# Patient Record
Sex: Female | Born: 1935 | Race: Black or African American | Hispanic: No | Smoking: Never smoker
Health system: Southern US, Community
[De-identification: ages and names within clinical notes are randomized; demographics above are authoritative.]

## PROBLEM LIST (undated history)

## (undated) DIAGNOSIS — E119 Type 2 diabetes mellitus without complications: Secondary | ICD-10-CM

## (undated) DIAGNOSIS — Z87898 Personal history of other specified conditions: Secondary | ICD-10-CM

## (undated) DIAGNOSIS — F0390 Unspecified dementia without behavioral disturbance: Secondary | ICD-10-CM

## (undated) DIAGNOSIS — I10 Essential (primary) hypertension: Secondary | ICD-10-CM

## (undated) DIAGNOSIS — F209 Schizophrenia, unspecified: Secondary | ICD-10-CM

## (undated) DIAGNOSIS — I509 Heart failure, unspecified: Secondary | ICD-10-CM

## (undated) HISTORY — DX: Type 2 diabetes mellitus without complications: E11.9

## (undated) HISTORY — DX: Essential (primary) hypertension: I10

## (undated) HISTORY — DX: Heart failure, unspecified: I50.9

## (undated) HISTORY — DX: Schizophrenia, unspecified: F20.9

## (undated) HISTORY — DX: Personal history of other specified conditions: Z87.898

## (undated) HISTORY — DX: Unspecified dementia, unspecified severity, without behavioral disturbance, psychotic disturbance, mood disturbance, and anxiety: F03.90

---

## 2004-05-05 ENCOUNTER — Ambulatory Visit: Payer: Self-pay | Admitting: Internal Medicine

## 2004-12-08 ENCOUNTER — Ambulatory Visit: Payer: Self-pay | Admitting: Internal Medicine

## 2004-12-22 ENCOUNTER — Ambulatory Visit: Payer: Self-pay | Admitting: Internal Medicine

## 2005-06-07 ENCOUNTER — Inpatient Hospital Stay: Payer: Self-pay | Admitting: Psychiatry

## 2005-06-07 ENCOUNTER — Other Ambulatory Visit: Payer: Self-pay

## 2005-06-16 ENCOUNTER — Other Ambulatory Visit: Payer: Self-pay

## 2005-06-16 ENCOUNTER — Inpatient Hospital Stay: Payer: Self-pay | Admitting: Internal Medicine

## 2005-06-22 ENCOUNTER — Other Ambulatory Visit: Payer: Self-pay

## 2006-10-10 IMAGING — CT CT CHEST W/O CM
2 series · 15 of 31 positions shown, 19 images · non-contrast
Comparison: none

REASON FOR EXAM: Left chest abnormality
COMMENTS:

[Series 2: soft tissue · axial · 0.77mm/px · z∈[-706,-666]mm · 2 of 56 slices shown]
[im 5/56  mediastinal]
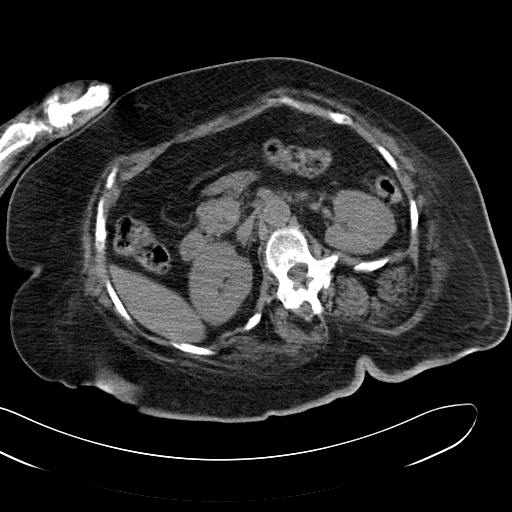
[im 13/56  mediastinal]
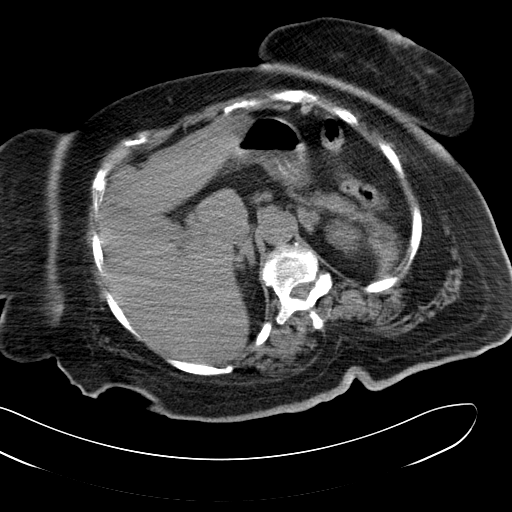

[Series 3: lung windows · axial · 0.77mm/px · z∈[-696,-476]mm · 13 of 54 slices shown, 17 images]
[im 5/54  mediastinal]
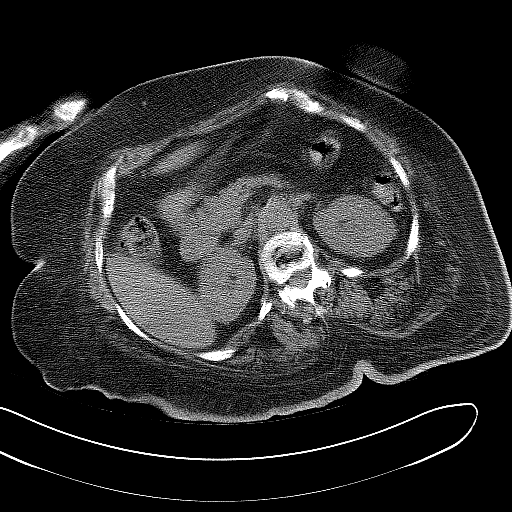
[im 5/54  lung]
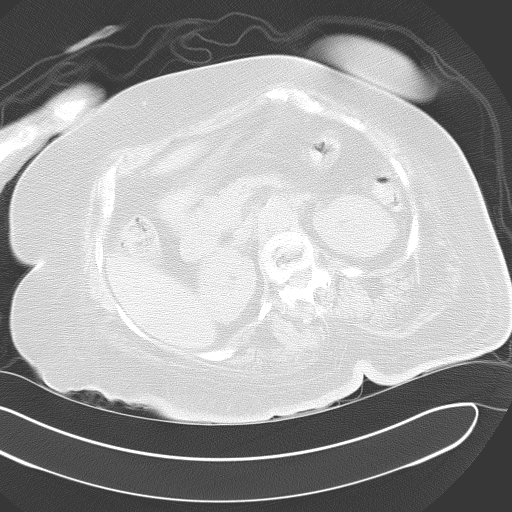
[im 9/54  lung]
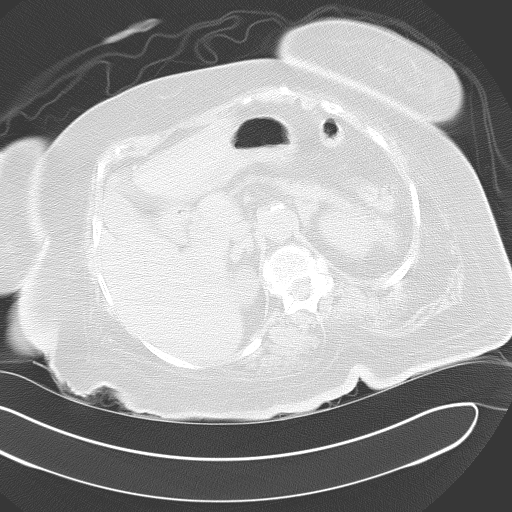
[im 13/54  lung]
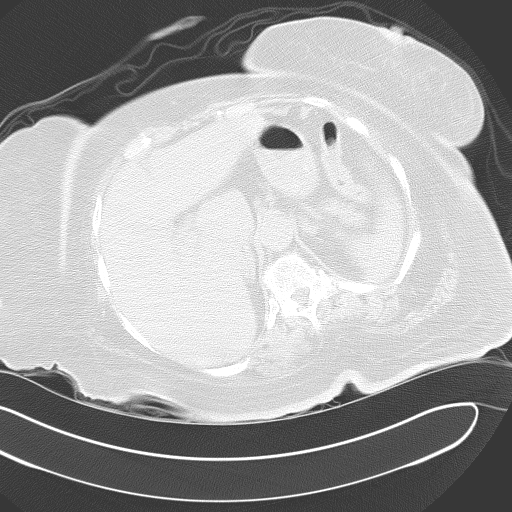
[im 17/54  lung]
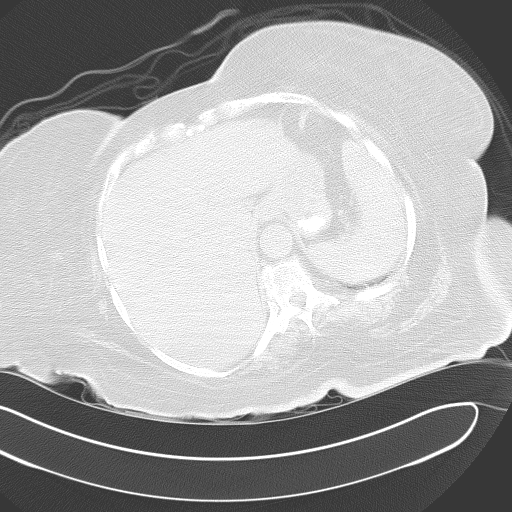
[im 21/54  mediastinal]
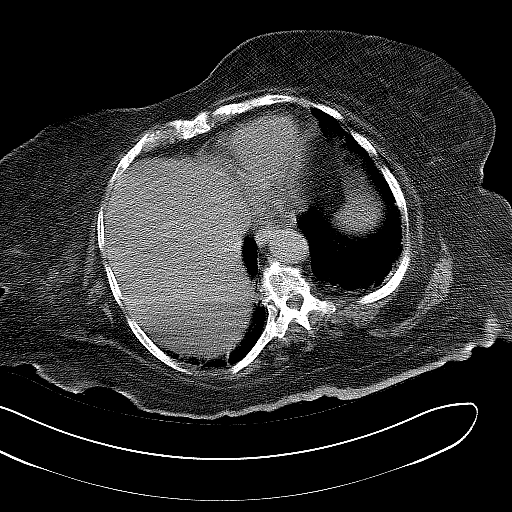
[im 21/54  lung]
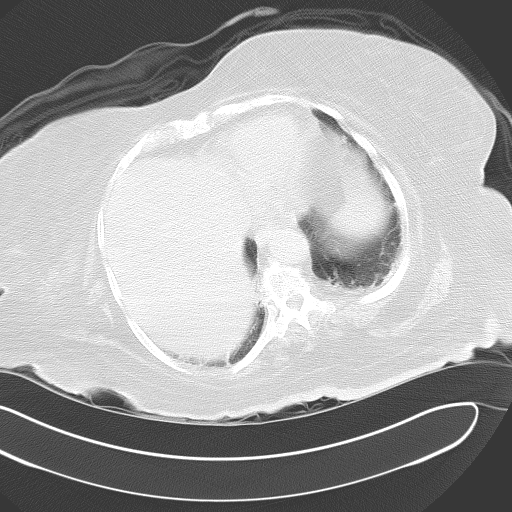
[im 25/54  lung]
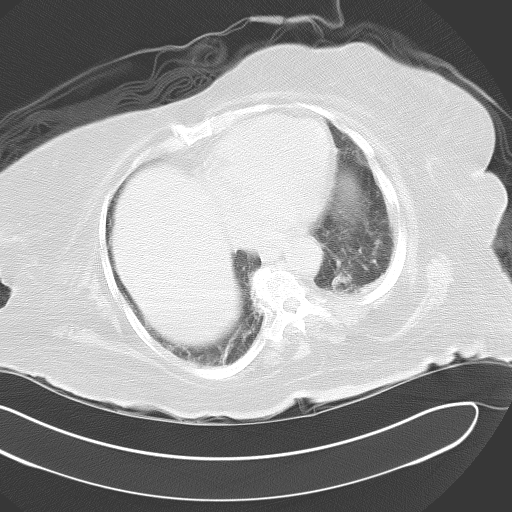
[im 27/54  lung]
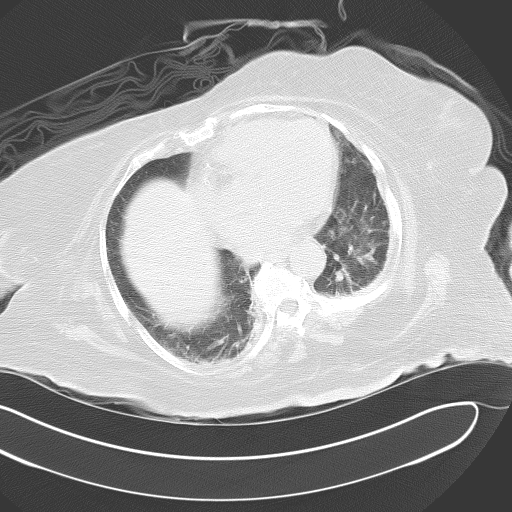
[im 29/54  lung]
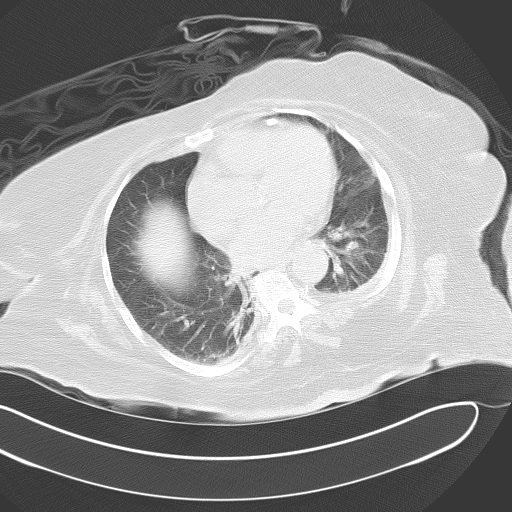
[im 33/54  mediastinal]
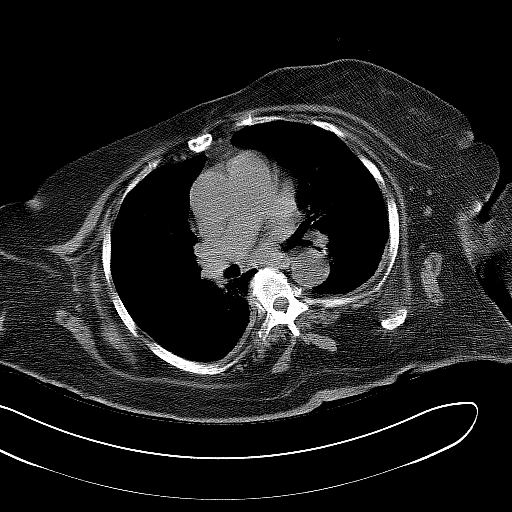
[im 33/54  lung]
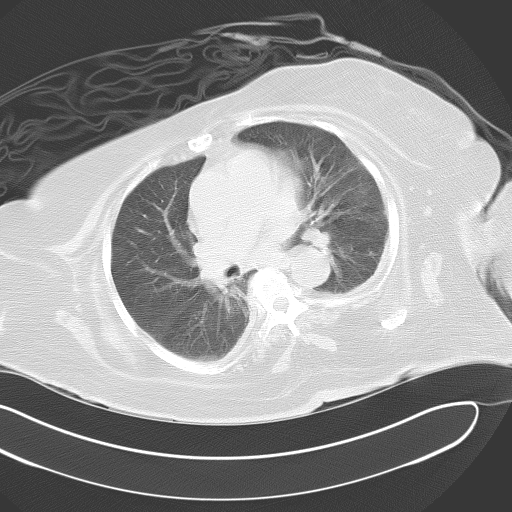
[im 37/54  lung]
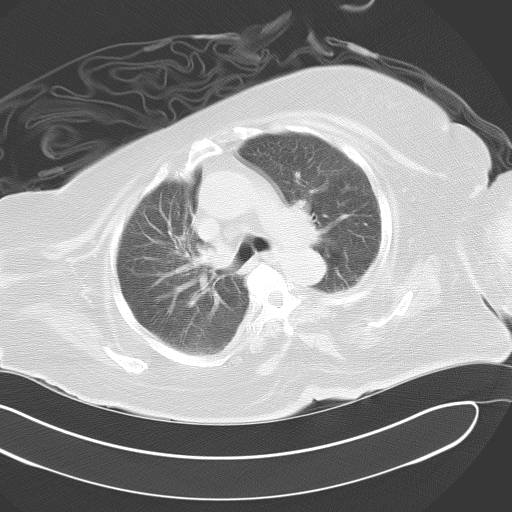
[im 41/54  lung]
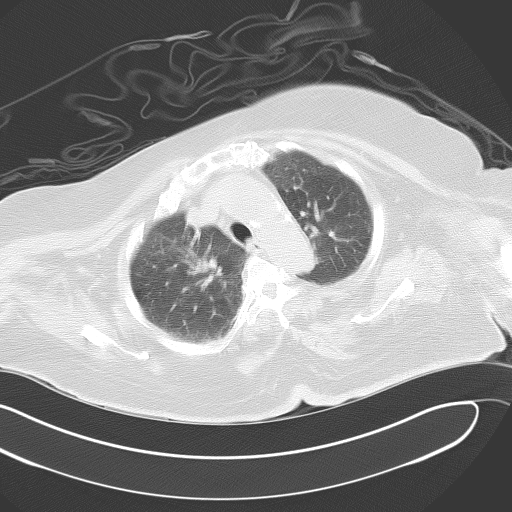
[im 45/54  lung]
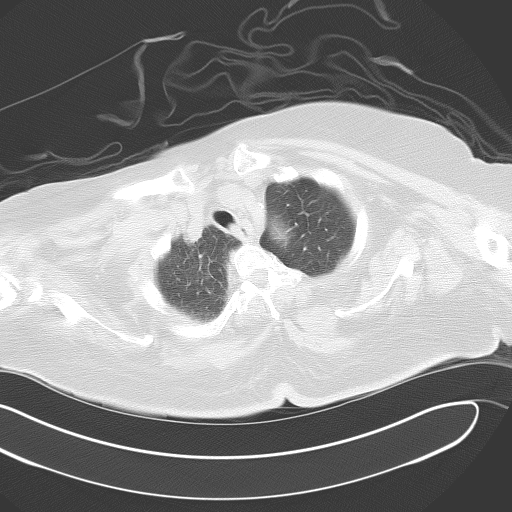
[im 49/54  mediastinal]
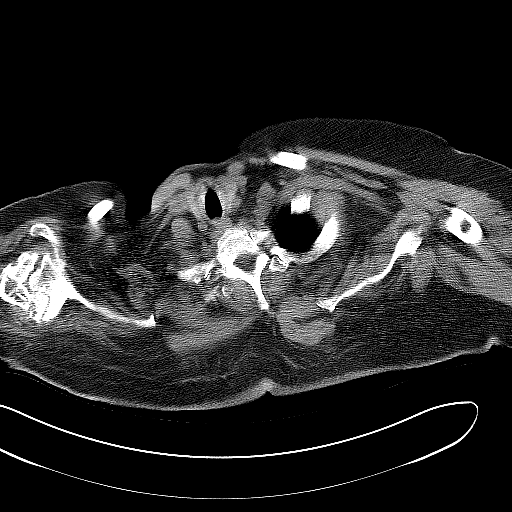
[im 49/54  lung]
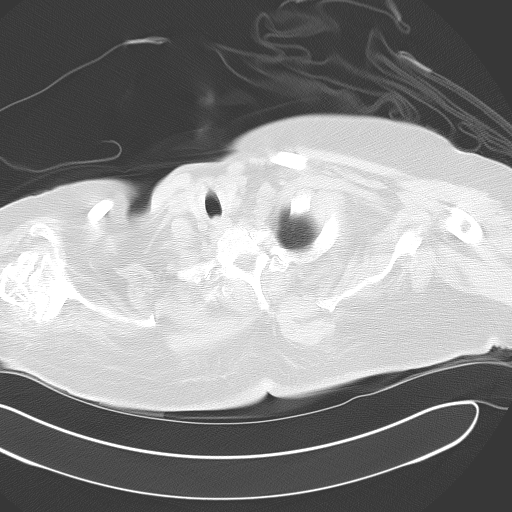

[15 of 31 positions shown; findings below may reference images not displayed]

PROCEDURE:     CT  - CT CHEST WITHOUT CONTRAST  - June 19, 2005  [DATE]

RESULT:       This study was performed without IV contrast as requested.

The caliber of the thoracic aorta is normal.    The cardiac chambers are not
enlarged.  I see no pleural nor pericardial effusion.  No bulky mediastinal
or hilar lymphadenopathy is seen.  There are degenerative changes noted at
multiple thoracic disc levels.  Extensive degenerative change of the RIGHT
shoulder is evident with a possible ununited fracture visible.  At lung
window settings, a very small amount of apical pleural scarring is present.
There are mildly increased interstitial markings in both lungs that likely
is reflective of pulmonary hypoinflation rather than to significant edema.
There is some scarring versus atelectasis at the lung bases.

Within the upper abdomen, the observed portions of the liver are normal for
this noncontrast study.  the spleen is not enlarged.  I see no adrenal
masses.
IMPRESSION: 1.     There is bilateral pulmonary hypoinflation.   I see no parenchymal
masses nor evidence of alveolar infiltrate.
2.     I do not see pathologic size mediastinal or hilar lymph nodes.
Certainly, it is difficult to separate possible lymph nodes from the
pulmonary vasculature given the lack of IV contrast.
3.     The heart is top normal in size with no overt evidence of congestive
heart failure.
4.     There is degenerative disc disease at multiple levels of the thoracic
spine.

## 2006-10-12 IMAGING — US ABDOMEN ULTRASOUND
1 series · 17 of 25 positions shown · non-contrast
Comparison: none

REASON FOR EXAM: Abnormal LFT's and fever
COMMENTS:

[Series 1: abdomen ultrasound · 17 of 29 slices shown]
[im 1/29]
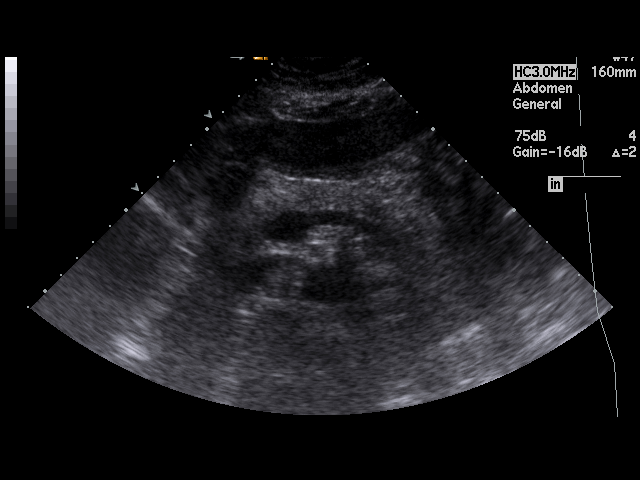
[im 3/29]
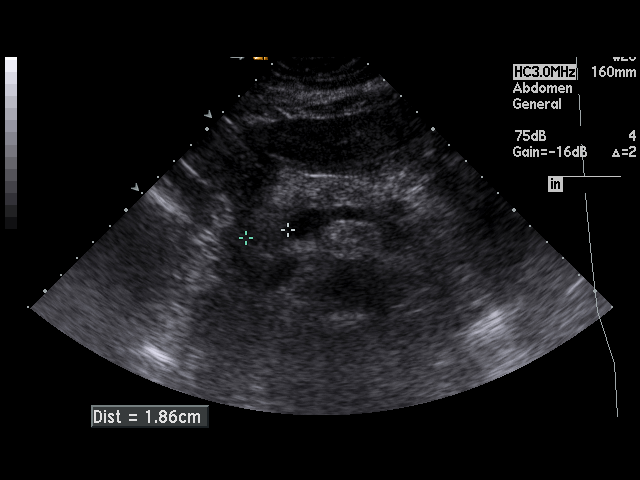
[im 4/29]
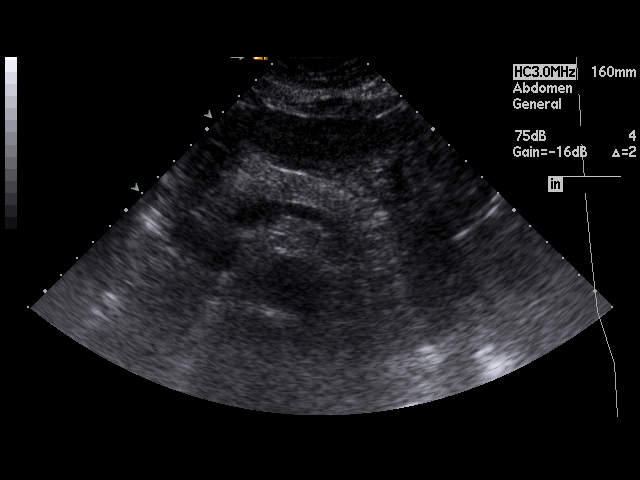
[im 6/29]
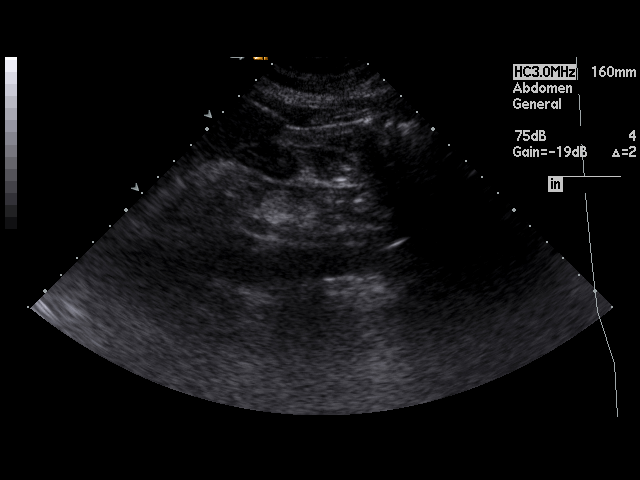
[im 8/29]
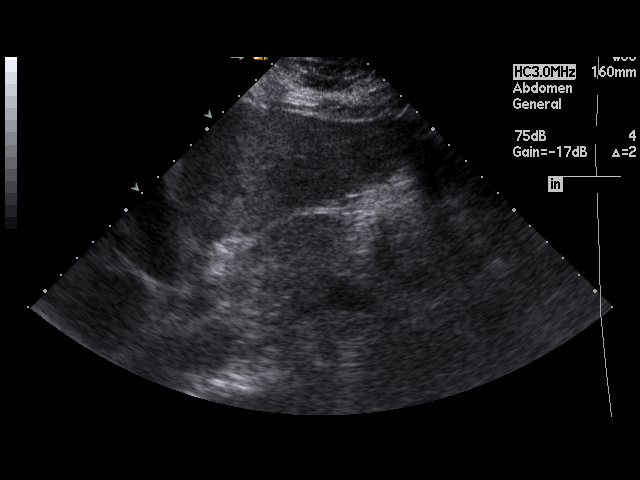
[im 10/29]
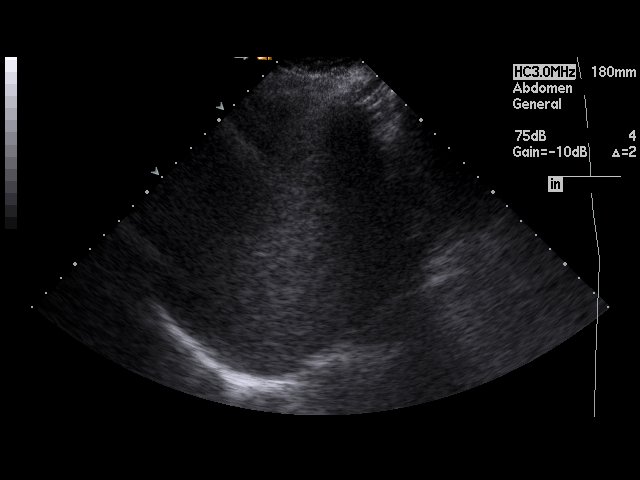
[im 11/29]
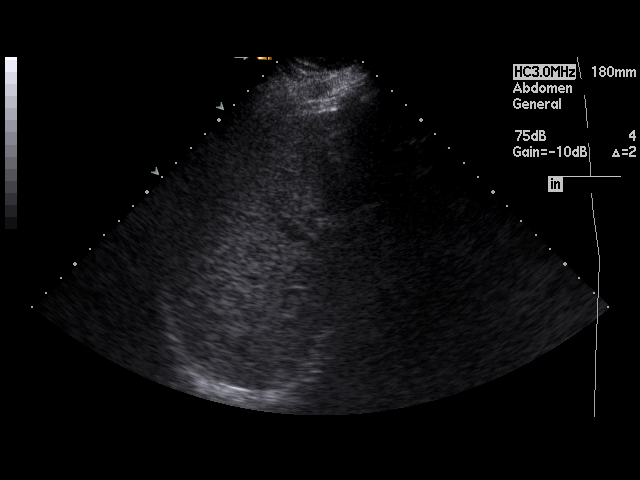
[im 13/29]
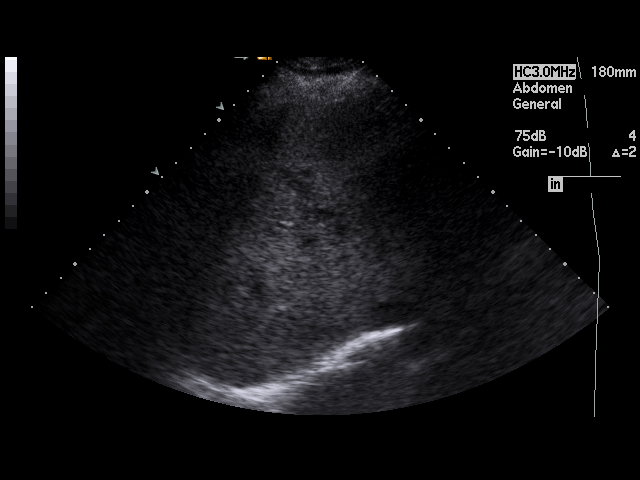
[im 15/29]
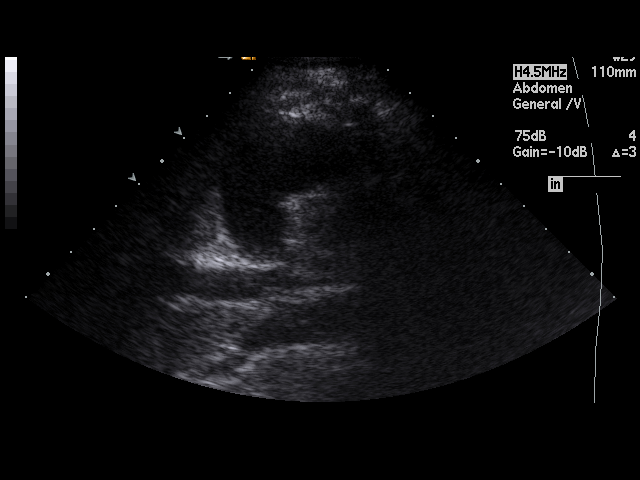
[im 16/29]
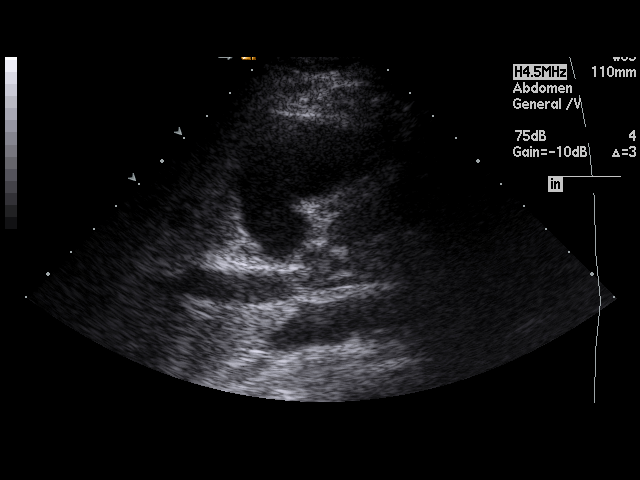
[im 18/29]
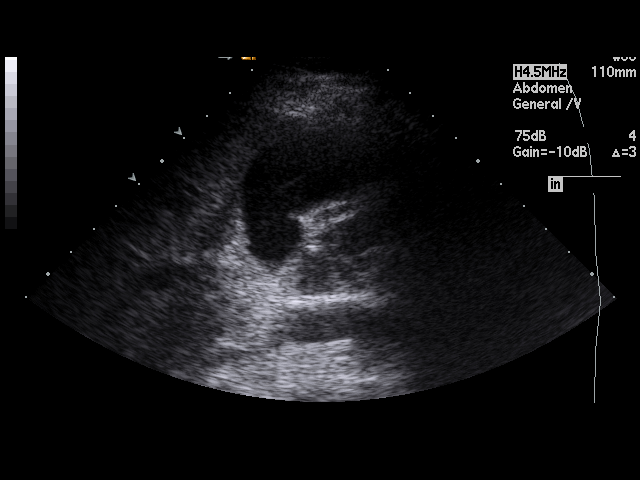
[im 19/29]
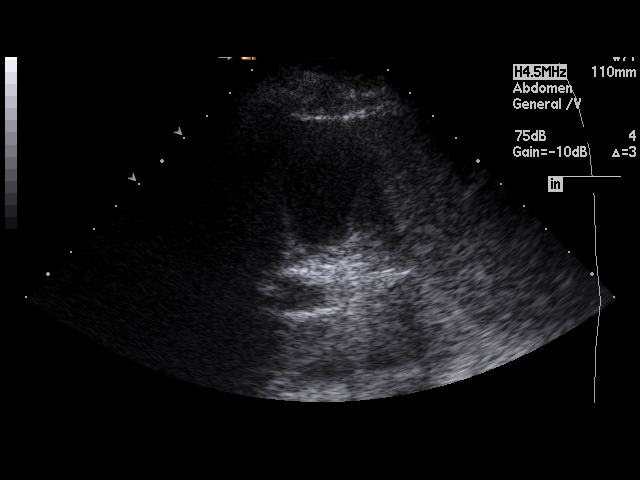
[im 22/29]
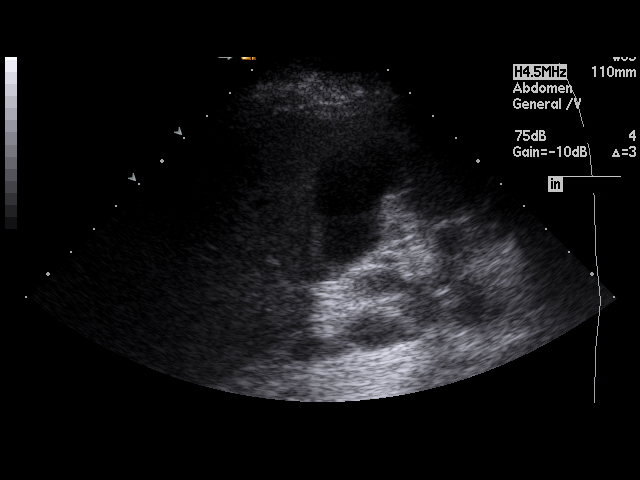
[im 23/29]
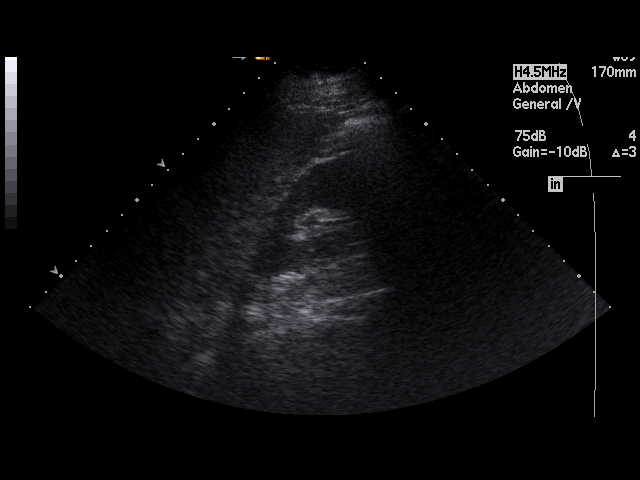
[im 25/29]
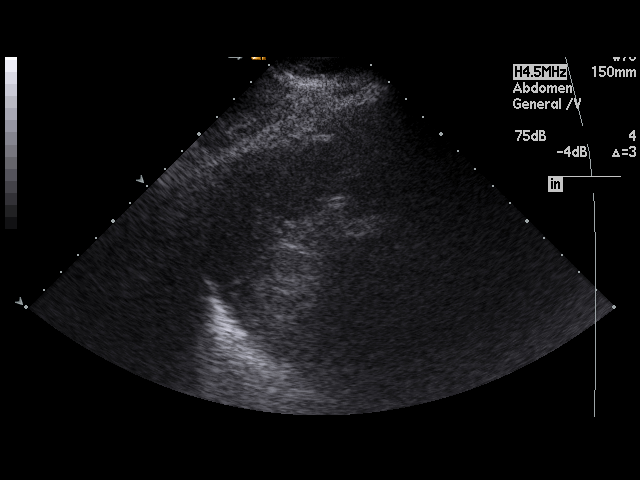
[im 26/29]
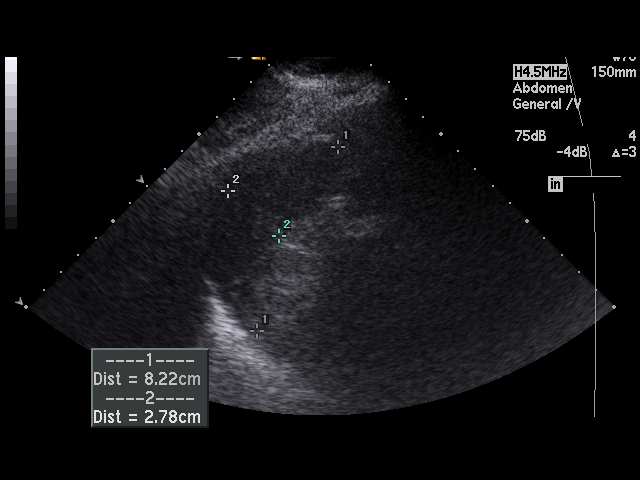
[im 29/29]
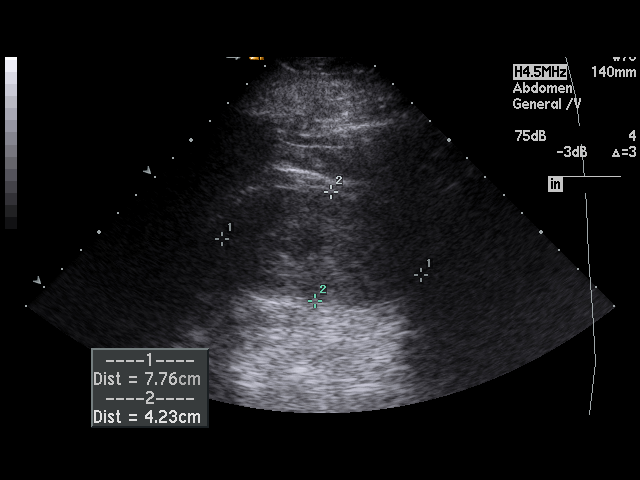

[17 of 25 positions shown; findings below may reference images not displayed]

PROCEDURE:     US  - US ABDOMEN GENERAL SURVEY  - June 21, 2005  [DATE]

RESULT:        The patient has abnormal hepatic function studies and
elevated temperature.

The study was limited due to the patient's clinical status and inability to
move from side to side.  The observed portions of the gallbladder are
grossly normal with no definite stones or wall thickening.  The common bile
duct is normal at 3.5 mm in diameter.  The observed portions of the liver
appeared normal.  The pancreas, spleen, and abdominal aorta were normal in
appearance.  The kidneys exhibit no evidence of obstruction.  Only a portion
of the LEFT kidney could be demonstrated.
IMPRESSION: The study is quite limited due to the patient's inability to move from side
to side.   I do not see objective evidence of gallstones nor other acute
intra-abdominal abnormality.  It may be of value to consider the patient for
CT scanning of the abdomen and pelvis given the laboratory abnormalities and
the patient's fever.

## 2006-10-25 IMAGING — CR DG CHEST 1V PORT
1 series · 1 of 1 positions shown · non-contrast
Comparison: none

REASON FOR EXAM: Rapid breathing and heart rate
COMMENTS:

[view not recorded]
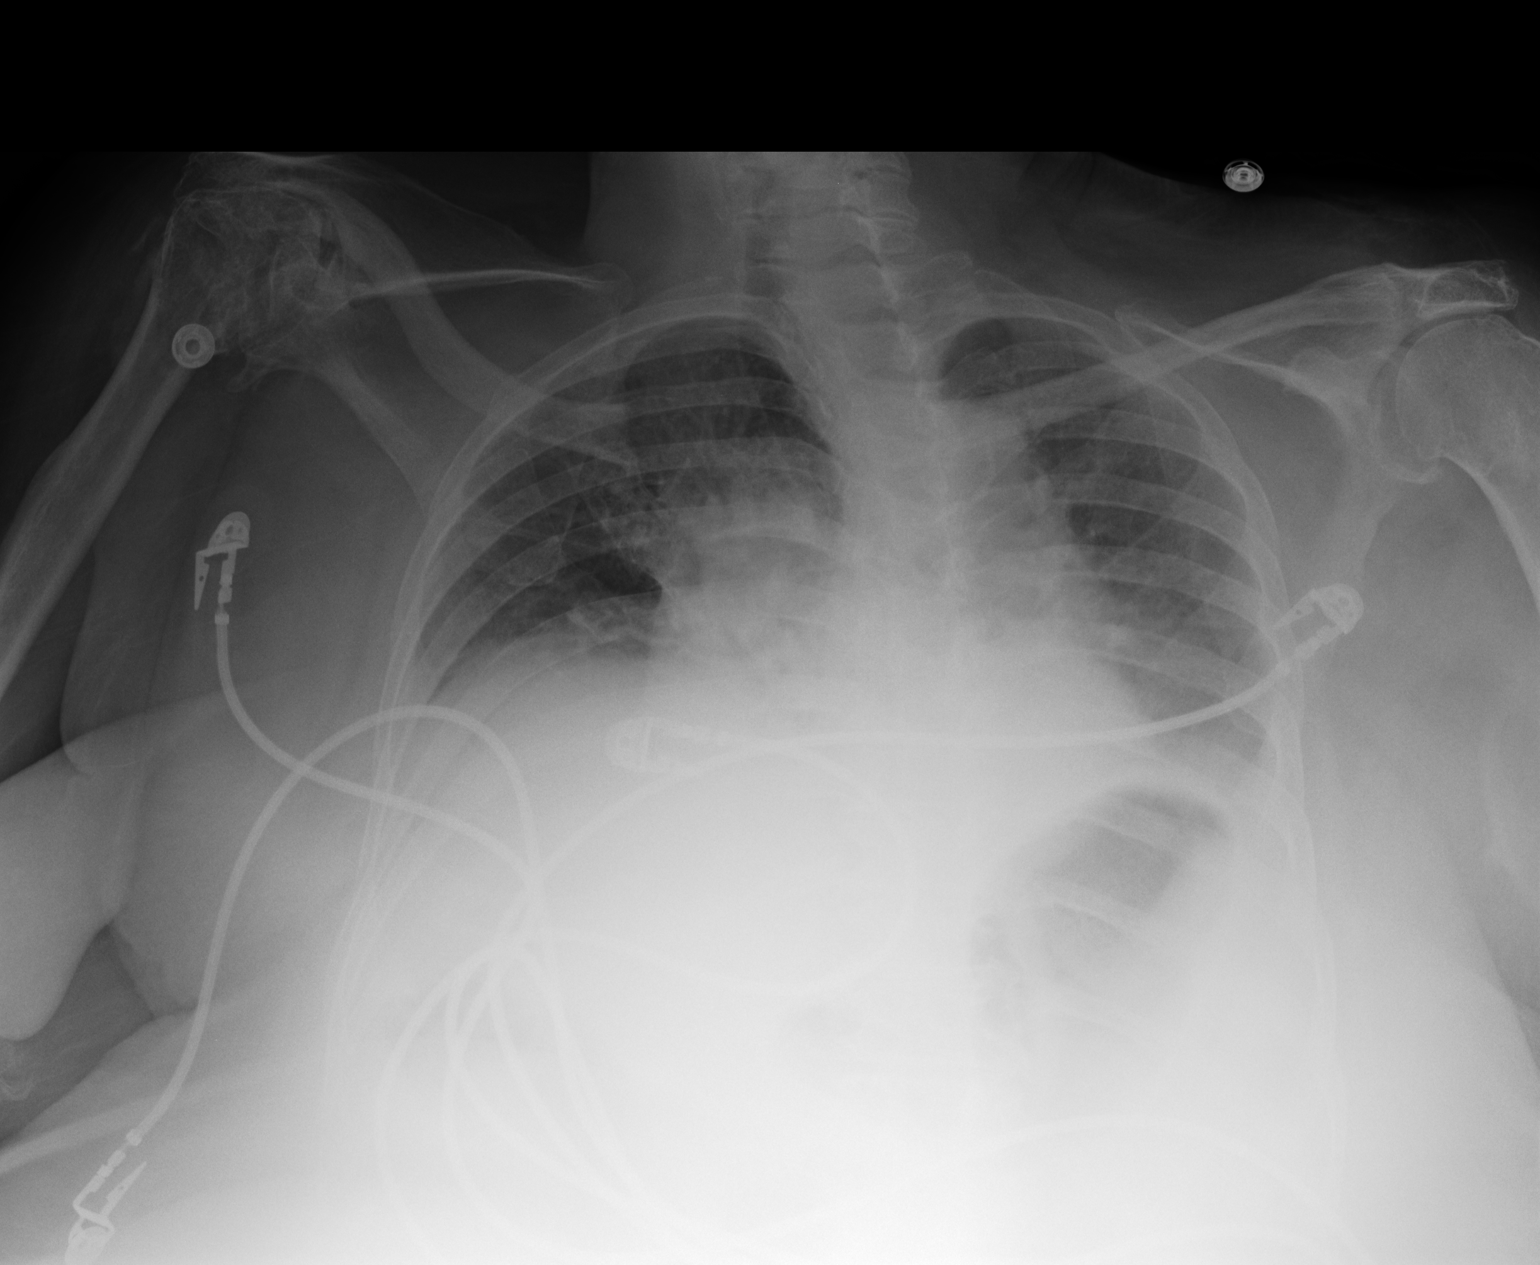

[1 of 1 positions shown; findings below may reference images not displayed]

PROCEDURE:     DXR - DXR PORTABLE CHEST SINGLE VIEW  - July 04, 2005  [DATE]

RESULT:     AP portable exam is obtained and compared with a prior exam of
06/29/2005.

The inspiratory effort appears incomplete. The lung fields appear clear. No
effusions are noted. There are noted degenerative changes in the RIGHT
shoulder.
IMPRESSION: Incomplete inspiratory effort. The lung fields are clear.

## 2009-03-03 ENCOUNTER — Emergency Department: Payer: Self-pay | Admitting: Emergency Medicine

## 2009-11-20 ENCOUNTER — Ambulatory Visit: Payer: Self-pay

## 2010-04-06 ENCOUNTER — Ambulatory Visit: Payer: Self-pay | Admitting: Internal Medicine

## 2012-05-27 ENCOUNTER — Ambulatory Visit: Payer: Self-pay | Admitting: Internal Medicine

## 2012-06-28 ENCOUNTER — Ambulatory Visit: Payer: Self-pay | Admitting: Oncology

## 2012-07-24 ENCOUNTER — Ambulatory Visit: Payer: Self-pay | Admitting: Oncology

## 2012-08-29 ENCOUNTER — Ambulatory Visit: Payer: Self-pay | Admitting: Oncology

## 2012-09-21 ENCOUNTER — Ambulatory Visit: Payer: Self-pay | Admitting: Oncology

## 2012-10-22 ENCOUNTER — Ambulatory Visit: Payer: Self-pay | Admitting: Oncology

## 2012-11-26 ENCOUNTER — Ambulatory Visit: Payer: Self-pay | Admitting: Oncology

## 2012-11-28 ENCOUNTER — Ambulatory Visit: Payer: Self-pay | Admitting: Oncology

## 2012-12-22 ENCOUNTER — Ambulatory Visit: Payer: Self-pay | Admitting: Oncology

## 2013-06-20 ENCOUNTER — Ambulatory Visit: Payer: Self-pay | Admitting: Orthopaedic Surgery

## 2013-06-20 ENCOUNTER — Inpatient Hospital Stay: Payer: Self-pay | Admitting: Internal Medicine

## 2013-06-20 LAB — URINALYSIS, COMPLETE
Bilirubin,UR: NEGATIVE
Ketone: NEGATIVE
Leukocyte Esterase: NEGATIVE
Ph: 6 (ref 4.5–8.0)
Protein: 30
Specific Gravity: 1.021 (ref 1.003–1.030)
Squamous Epithelial: >30

## 2013-06-20 LAB — COMPREHENSIVE METABOLIC PANEL
Alkaline Phosphatase: 77 U/L
Anion Gap: 8 (ref 7–16)
Bilirubin,Total: 0.3 mg/dL (ref 0.2–1.0)
Calcium, Total: 8.8 mg/dL (ref 8.5–10.1)
Chloride: 107 mmol/L (ref 98–107)
Co2: 25 mmol/L (ref 21–32)
Creatinine: 0.86 mg/dL (ref 0.60–1.30)
EGFR (African American): >60
Potassium: 4 mmol/L (ref 3.5–5.1)
SGOT(AST): 18 U/L (ref 15–37)
SGPT (ALT): 25 U/L (ref 12–78)
Sodium: 140 mmol/L (ref 136–145)
Total Protein: 7.9 g/dL (ref 6.4–8.2)

## 2013-06-20 LAB — CBC WITH DIFFERENTIAL/PLATELET
Basophil #: 0.1 10*3/uL (ref 0.0–0.1)
Eosinophil #: 0 10*3/uL (ref 0.0–0.7)
Eosinophil %: 0.2 %
HGB: 12.9 g/dL (ref 12.0–16.0)
Lymphocyte #: 1.9 10*3/uL (ref 1.0–3.6)
Lymphocyte %: 10.8 %
Monocyte #: 0.9 x10 3/mm (ref 0.2–0.9)
Neutrophil #: 14.9 10*3/uL — ABNORMAL HIGH (ref 1.4–6.5)
Neutrophil %: 83.4 %
Platelet: 207 10*3/uL (ref 150–440)
RDW: 14.3 % (ref 11.5–14.5)

## 2013-06-20 LAB — PROTIME-INR: Prothrombin Time: 14.2 secs (ref 11.5–14.7)

## 2013-06-21 LAB — PROTIME-INR: INR: 1.3

## 2013-06-21 LAB — BASIC METABOLIC PANEL
Anion Gap: 4 — ABNORMAL LOW (ref 7–16)
Chloride: 107 mmol/L (ref 98–107)
EGFR (African American): 59 — ABNORMAL LOW
EGFR (Non-African Amer.): 51 — ABNORMAL LOW
Glucose: 128 mg/dL — ABNORMAL HIGH (ref 65–99)
Osmolality: 283 (ref 275–301)
Sodium: 139 mmol/L (ref 136–145)

## 2013-06-21 LAB — CBC WITH DIFFERENTIAL/PLATELET
Basophil #: 0.1 10*3/uL (ref 0.0–0.1)
Eosinophil #: 0.4 10*3/uL (ref 0.0–0.7)
Eosinophil %: 2.3 %
HGB: 10.2 g/dL — ABNORMAL LOW (ref 12.0–16.0)
Lymphocyte #: 2.9 10*3/uL (ref 1.0–3.6)
Lymphocyte %: 17.9 %
MCH: 29.7 pg (ref 26.0–34.0)
MCHC: 33.3 g/dL (ref 32.0–36.0)
MCV: 89 fL (ref 80–100)
Monocyte #: 1.9 x10 3/mm — ABNORMAL HIGH (ref 0.2–0.9)
Monocyte %: 11.7 %
Neutrophil #: 11.1 10*3/uL — ABNORMAL HIGH (ref 1.4–6.5)
Neutrophil %: 67.8 %
Platelet: 201 10*3/uL (ref 150–440)
RDW: 14.1 % (ref 11.5–14.5)
WBC: 16.3 10*3/uL — ABNORMAL HIGH (ref 3.6–11.0)

## 2013-07-30 ENCOUNTER — Encounter: Payer: Self-pay | Admitting: *Deleted

## 2013-08-04 ENCOUNTER — Encounter: Payer: Self-pay | Admitting: Cardiovascular Disease

## 2014-11-13 NOTE — Discharge Summary (Signed)
PATIENT NAME:  Shannon Krause, Shannon Krause MR#:  [id number] DATE OF BIRTH:  Dec 08, 1928  DATE OF ADMISSION:  06/20/2013 DATE OF DISCHARGE:  06/23/2013  DISCHARGE DIAGNOSES: 1.  Left femur fracture, status post left hip pinning postoperative day #3, status post fall and now doing well, requiring aggressive therapy.  2.  Uncomplicated urinary tract infection, finished antibiotics.  3.  Chronic diastolic heart failure, will require outpatient cardiology followup.  She is well compensated at this time.   SECONDARY DIAGNOSES: 1.  Hypertension.  2.  Diabetes.  3.  Dementia.  4.  Schizophrenia.   CONSULTATIONS: 1.  Orthopedic, Weyman Pedro, MD 2.  Physical therapy.   PROCEDURES AND RADIOLOGY:  1.  Left hip fracture, status post repair by Dr. Jeannene Patella on the 28th of November.  2.  Left hip x-ray on the 28th of November showed significant reduction of the left intertrochanteric hip fracture following ORIF.  3.  Left hip x-ray on the 28th of November showed ORIF of the intertrochanteric fracture.  4.  Chest x-ray on the 28th of November showed no evidence of acute cardiopulmonary disease.   MAJOR LABORATORY PANEL:  1.  UA on admission was showing 2+ bacteria, 15 to 30 WBCs  in clumps.  2.  Urine culture was negative on the 27th of November.   HISTORY AND SHORT HOSPITAL COURSE: The patient is an 81 year old female with the above-mentioned medical problems who was admitted status post fall with left hip fracture. Please see Dr. Rob Hickman dictated history and physical for further details. Orthopedic consultation was done with Dr. Danton Sewer, who operated on her on the 28th of November without any immediate complication. The patient subsequently was doing well, was working with physical therapy and is being discharged to rehab facility today in stable condition.   PHYSICAL EXAMINATION: VITAL SIGNS: On the date of discharge, her vital signs are as follows: Temperature 96.6, heart rate 71 per minute,  respirations 18 per minute, blood pressure 125/75 mmHg.  She is saturating 96% on room air.   CARDIOVASCULAR: S1, S2 normal. No murmurs, rubs or gallop.  LUNGS: Clear to auscultation bilaterally. No wheezing, rales, rhonchi or crepitation.  ABDOMEN: Soft, benign.  NEUROLOGIC: Nonfocal examination. All other physical examination remained at baseline.   DISCHARGE MEDICATIONS: 1.  Amlodipine 10 mg p.o. daily.  2.  Atorvastatin 10 mg p.o. at bedtime.  3.  Coreg 25 mg p.o. b.i.d.  4.  Multivitamins once daily.  6.  Donepezil 10 mg p.o. at bedtime.  7.  Insulin, Levemir 8 units subcu at bedtime.  8.  Mapap 650 mg p.o. every 4 hours as needed.  9.  Namenda 10 mg by mouth twice daily.  11.  Insulin, NovoLog 5 units subcu 3 times a day.  12.  Potassium chloride 20 mEq p.o. b.i.d.  13.  Robafen DM 10 mL p.o. every 4 hours as needed.  14.  Sertraline 25 mg p.o. daily.  15.  Systane ophthalmic solution 2 drops to each eye twice a day.  16.  Vitamin D3 50,000 international units once a month.  17.  Magnesium oxide 400 mg p.o. b.i.d.  18.  Risperidone 0.25 mg half tablet p.o. daily.  19.  Timolol 0.25% eye drop to each affected eye 1 at bedtime.  20.  Lovenox 30 mg subcu twice a day for 2 more weeks.   DISCHARGE DIET: Low sodium, low cholesterol, low fat, monitoring daily.     DISCHARGE ACTIVITY: As tolerated and also per orthopedic instruction.  DISCHARGE INSTRUCTIONS AND FOLLOWUP: The patient was instructed to follow up with her primary care physician, Dr. Toy CookeyErnest Eason, in 1 to 2 weeks at [facility].  She will need [facility], Dr. Mariah MillingGollan or Dr. Kirke CorinArida, in 2 to 4 weeks. [facility] in 4 to 6 weeks.   TOTAL TIME SPENT WITH THIS PATIENT:  55 minutes.    ____________________________ Ellamae SiaVipul S. Sherryll BurgerShah, MD vss:cs D: 06/23/2013 13:56:00 ET T: 06/23/2013 14:50:59 ET JOB#: [id number]  cc: Danilyn Cocke S. Sherryll BurgerShah, MD,  <Dictator> Serita ShellerErnest B. Maryellen PileEason, MD Antonieta Ibaimothy J. Gollan, MD Chelsea AusMuhammad A. Kirke CorinArida, MD Illene LabradorJames P. Angie FavaHooten Jr., MD Ellamae SiaVIPUL S [facility] MD ELECTRONICALLY SIGNED 06/26/2013 18:06

## 2014-11-13 NOTE — H&P (Signed)
PATIENT NAME:  Shannon Krause, Shannon Krause MR#:  [id number] DATE OF BIRTH:  November 11, 1928  DATE OF ADMISSION:  06/20/2013  PRIMARY CARE PHYSICIAN:  Dr. _____  REFERRING PHYSICIAN: Dr. Bayard Males   CHIEF COMPLAINT:  Fall and left hip intertrochanteric fracture.   HISTORY OF PRESENT ILLNESS:  The patient is an 81 year old African-American female with a past medical history of diabetes mellitus, dementia, schizophrenia, congestive heart failure and hypertension, currently residing at [facility] with a DNR CODE STATUS, has sustained a fall yesterday. As the patient is demented and no family members are available at bedside, I could not obtain any history from the patient. According to the ER records and ER  hospital staff, the patient rolled out of the bed yesterday at around 2:00 p.m. [facility] staff called ambulatory x-ray unit and the patient had x-ray done and diagnosed with left hip intertrochanteric fracture and the patient was eventually transferred to ER yesterday evening. The patient was given some pain medications to control her pain. On-call orthopedist doctor, Dr. Jeannene Patella, was called and notified by ER physician, Dr. Bayard Males. Hospitalist team is called to admit the patient. As reported before, with the patient being demented and with schizophrenia, was unable to get any history from the patient. No family members are available at bedside and I could not reach any of them. The patient could recall her name, but unable to obtain a history; but she is resting comfortably.   PAST MEDICAL HISTORY: Recent history of abnormal mammogram, chronic history of congestive heart failure, hypertension, diabetes mellitus, dementia, schizophrenia.   PAST SURGICAL HISTORY: Not available.   ALLERGIES:  SHE IS ALLERGIC TO ACE INHIBITORS AND ASPIRIN.    PSYCHOSOCIAL HISTORY: Residing at [facility]. According to the old record, never smoked, never used any alcohol.    FAMILY HISTORY:  Unobtainable.  REVIEW OF SYSTEMS:  Unobtainable.   HOME MEDICATIONS:  1.  Vitamin D3 once a month. 2.  Tylenol 650 mg as needed. 3.  Timolol eyedrops. 4.  Sertraline 50 mg once daily.  5.  Potassium chloride 10 mEq 2 tablets p.o. 2 times a day. 6.  Norvasc 10 mg once daily. 7.  Namenda 10 mg once daily.  8.  Magnesium oxide 400 mg; the frequency of dosing is unknown.  9.  Lipitor 10 mg once a day.  10.  Levemir 8 units subcutaneous once daily.  11.  Coreg 25 mg 1 tablet p.o. b.i.d.   PHYSICAL EXAMINATION:  VITAL SIGNS: Temperature 98.4, pulse 77, respirations 18, blood pressure 137/75, pulse ox 95%.  GENERAL APPEARANCE: Not in any acute distress, obese lady.  HEENT: Normocephalic, atraumatic. Pupils are equally reacting to light and accommodation. No scleral icterus. No conjunctival injection. No sinus tenderness. Moist mucous membranes. No sinus tenderness.  NECK: Supple. No JVD. No thyromegaly. Range of motion is intact.  LUNGS: Clear to auscultation bilaterally. No accessory muscle use and no anterior chest wall tenderness on palpation. CARDIAC: S1, S2 normal. Regular rate and rhythm. No murmurs.  GASTROINTESTINAL: Soft, obese, bowel sounds are positive in all four quadrants. Nontender, nondistended. No hepatosplenomegaly. No masses felt.  NEUROLOGIC: Awake and alert, but disoriented, unable to get any history from the patient. Reflexes are 2+. Motor and sensory are grossly intact. Moving right extremity spontaneously. Left hip is externally rotated.  SKIN: Warm and normal turgor. No rashes. No lesions. Peripheral pulses are 2+.  PSYCHIATRIC: Mood and affect could not be elicited given the history of chronic dementia and schizophrenia.  LABORATORY AND IMAGING STUDIES:  According to the ambulatory x-ray, positive for a left hip intertrochanteric fracture. The patient's LFTs are normal. BMP is normal except a BUN is at 21 and glucose is at 174. CBC is pending.   Chest x-ray is  pending.   A 12-lead EKG ordered, status pending.   ASSESSMENT AND PLAN: An 81 year old African-American female residing at [facility] with CODE STATUS of DO NOT RESUSCITATE will be admitted with the following assessment and plan.  1.  Left hip intertrochanteric fracture. Will admit her to ortho unit. Will optimize her for surgery after reviewing pending labs, chest x-ray and EKG. The patient will be kept n.p.o. and will provide her IV fluids and proton pump inhibitor. Ortho consult is placed to on-call Ortho physician, Dr. Jeannene PatellaGallizzi.  We will provide her IV morphine as needed basis for pain management.  2.  Chronic history of congestive heart failure. Clinically, patient does not look fluid overloaded. Chest x-ray is pending at this time.  3.  Diabetes mellitus: The patient will be on sliding-scale insulin as she is n.p.o.  4. Hypertension. We will resume her home medications and up-titrate medications as needed basis.  5. Gastrointestinal prophylaxis with proton pump inhibitor, deep vein thrombosis prophylaxis with sequential compression devices only, as most likely patient will be cleared for surgery in a.m. if labs and chest x-ray are normal. She is DO NOT RESUSCITATE.   Total time spent on admission: 45 minutes.    ____________________________ Ramonita LabAruna Kila Godina, MD ag:NTS D: 06/20/2013 02:24:00 ET T: 06/20/2013 03:08:06 ET JOB#: [id number]  cc: Ramonita LabAruna Senaida Chilcote, MD, <Dictator> Ramonita LabARUNA Shantai Tiedeman MD ELECTRONICALLY SIGNED 06/25/2013 7:27

## 2014-11-13 NOTE — Op Note (Signed)
PATIENT NAME:  Shannon Krause, Shannon Krause MR#:  [id number] DATE OF BIRTH:  1929/02/11  DATE OF PROCEDURE:  06/20/2013  PREOPERATIVE  DIAGNOSIS:  Left reverse oblique intertrochanteric fracture.  POSTOPERATIVE DIAGNOSIS: Left reverse oblique intertrochanteric fracture.  SURGEON:  Danton SewerMichael Derica Leiber, MD  INDICATIONS FOR PROCEDURE:  Ms. Shannon Krause is an 81 year old woman who fell out of bed and suffered the above mentioned injury.  She has significant past medical disease including dementia and reported memory loss as well.  She was admitted under hip fracture protocol to Dr. Amado CoeGouru and Orthopedics was consulted.  After reviewing the films, operative plan was discussed with the internal medicine physician and consent was obtained from her niece over the phone for operative fixation of left hip.  DESCRIPTION OF PROCEDURE:  Consent was signed, operative site was marked.  The patient was brought back to the OR.  General endotracheal anesthesia was obtained.  Time out was then performed to confirm antibiotics were given. The patient was moved over on the fracture table with the foot placed in the fracture reduction boot.  The right down leg was placed in a well leg holder.  The arm was brought over the chest in a padded fashion.  We then brought in the image intensifier to perform closed reduction.  We took AP and lateral to confirm near complete reduction, but it was determined that we would need to have an open approach to improve the reduction parameters to the fracture characteristics.    The patient was then prepped and draped in the usual sterile fashion.  A 4 cm longitudinal incision was made after marking along the lateral aspect of the greater trochanteric.  A series of joy sticks and Cobb elevator were used to elevate the fragment and manipulate to improve our AP reduction.  This was saved.  After that, we made a 3 cm incision approximately 3 cm breadth above the greater trochanter.  We then used a 10 blade to go  through the skin, electrocautery to go down and Mayo scissors split the abductors in line with their fibers to expose the tip of the greater trochanter. We then introduced the guidewire.  This was confirmed and both AP and lateral fluoroscopy.  Once reduction was checked, we maintained the reduction mover through the open lateral port.  We then overdrilled with the opening reamer.  An 11 x 125 x 135 degree implant short TFN was chosen.  We then placed this implant down.  After that, we located the helical blade through the lateral incision. A 4 cm incision was then made laterally through the vastus lateralis and IT band, exposed down to femur.  We then introduced the guidewire, trying to maintain a middle center center position. We then overdrilled the guidewire and placed a 90 mm helical blade through the incision.  This was placed and was confirmed on both AP and lateral fluoroscopy.  We then turned out attention to the distal interlock.  The triple sleeve was then used through the other incision to drill a distal interlock.  A 36 mm screw was placed.  All components were final tightened.  Traction was released and all guide equipment was removed.  We then closed the IT band with a running 0 Vicryl stitch and the fascia of the abductors superiorly.  We then used interrupted 2-0 Vicryl to close the remainder of the skin and staples with Dermabond on top.  We then applied sterile dressings and tape.  The patient was then extubated and brought  to PACU in stable condition.    BLOOD LOSS:  100 mL.  COMPLICATIONS:  None.  FLUIDS:  Per anesthesia.  SPECIMENS: None.  POSTOPERATIVE COURSE:  The patient will be weightbearing as tolerated left lower extremity.  We will get postoperative x-rays.  The patient will received postoperative antibiotics. She will also keep both chemo and mechanical DVT prophylaxis ordered.  Anticipate discharge to a skilled nursing facility in 3 to 5 days.  The patient to followup with  Dr. Ernest Pine in his clinic postoperatively.      ____________________________ Weyman Pedro, MD mag:dp D: 06/20/2013 16:10:47 ET T: 06/20/2013 16:22:16 ET JOB#: [id number]  cc: Weyman Pedro, MD, <Dictator> Weyman Pedro MD ELECTRONICALLY SIGNED 06/22/2013 7:56

## 2014-11-13 NOTE — Consult Note (Signed)
  DATE OF ADMISSION:   06/20/2013 COMPLAINT:   Left hip fracture OF PRESENT ILLNESS:   Pt is a 81 y/o women who fell from bed and admitted to the hospital with a left hip fracture.  Pt has significant baseline dementia and is DNR. She is not aware that she broke her hip when she fell out of bed yesterday.  She is a very poor historian. MEDICAL HISTORY: Hypertension.Diabetes mellitus.Alzheimer's.Psychosis. Major depression.  SURGICAL HISTORY: breast biopsy in the medical chart    aspirin and ace inhibitor HISTORY: per medical record the patient does not smoke. She does drink alcohol. There is no use of recreational drugs.   HISTORY: Unable to obtain.  OF SYSTEMS: left hip pain There is no headache, no speech, no blurred vision, no numbness or tingling. No chest pain shortness of breath, no palpitations. No abdominal pain, no nausea or No lower extremity edema. No rash. The rest of the systems were and they were negative.    EXAMINATION:  AFVSS APPEARANCE: This is an elderly African American female is laying down in bed, not in acute distress at the present time.      PERRLA    Supple.     Regular rate   non labored respiration    no pain on palpation of b/l UE; SILT C5-T1 full ROM; left upper extremity increasd tone; 4/5LE SILT L3-S1; +1 dp pulse; full ROM of toes, ankles knees, ;pain with log roll of left hip, no pain with log roll right hip; strength 4/5  Skin is warm to touch and skin turgor is decreased. There is no no ulceration, no nodules.  DIAGNOSTIC, AND RADIOLOGICAL FINDINGS: Sodium 140, potassium chloride 107, bicarb 25, calcium 8.8. Blood urea nitrogen 21, 0.86, serum glucose 174. White blood cell 17.9, hemoglobin 12.9, 38.1, platelet count 207 negative left hipfracture of the lef hip  81 y/o women with left hip fracture with significant demtia AND PLAN: Medicine admit under hip fx protocolPlan to obtain consent from family for ORIF left hipPlan for todaywill need chemo dvt  prophalxis after surgeryrecommend calcium and vitamin d after surgerywill follow    Electronic Signatures: Charleston RopesKelly, Brandy (RN) (Signed on 28-Nov-14 10:56)  Ladoris GeneAuthored  Oswaldo Cueto A (MD) (Signed on 28-Nov-14 09:14)  Authored   Last Updated: 28-Nov-14 10:56 by Charleston RopesKelly, Brandy (RN)

## 2015-11-22 DEATH — deceased
